# Patient Record
Sex: Male | Born: 1970 | Race: White | Hispanic: No | Smoking: Never smoker
Health system: Southern US, Community
[De-identification: ages and names within clinical notes are randomized; demographics above are authoritative.]

---

## 2006-10-25 ENCOUNTER — Emergency Department: Payer: Self-pay | Admitting: Emergency Medicine

## 2010-08-13 ENCOUNTER — Emergency Department: Payer: Self-pay | Admitting: Emergency Medicine

## 2013-09-07 ENCOUNTER — Emergency Department: Payer: Self-pay | Admitting: Emergency Medicine

## 2013-09-09 ENCOUNTER — Inpatient Hospital Stay: Payer: Self-pay | Admitting: Orthopedic Surgery

## 2013-09-09 LAB — CBC WITH DIFFERENTIAL/PLATELET
Basophil #: 0 10*3/uL (ref 0.0–0.1)
Basophil %: 0.4 %
Eosinophil %: 4.6 %
HCT: 48.8 % (ref 40.0–52.0)
HGB: 16.3 g/dL (ref 13.0–18.0)
MCHC: 33.5 g/dL (ref 32.0–36.0)
Monocyte #: 0.9 x10 3/mm (ref 0.2–1.0)
Monocyte %: 8.7 %
Neutrophil #: 7.2 10*3/uL — ABNORMAL HIGH (ref 1.4–6.5)

## 2013-09-09 LAB — BASIC METABOLIC PANEL
BUN: 11 mg/dL (ref 7–18)
Calcium, Total: 9.6 mg/dL (ref 8.5–10.1)
Chloride: 108 mmol/L — ABNORMAL HIGH (ref 98–107)
Creatinine: 1.27 mg/dL (ref 0.60–1.30)
EGFR (Non-African Amer.): >60
Glucose: 92 mg/dL (ref 65–99)
Osmolality: 277 (ref 275–301)
Potassium: 4.1 mmol/L (ref 3.5–5.1)
Sodium: 139 mmol/L (ref 136–145)

## 2013-09-10 LAB — CREATININE, SERUM
Creatinine: 1.39 mg/dL — ABNORMAL HIGH (ref 0.60–1.30)
EGFR (Non-African Amer.): >60

## 2013-09-11 LAB — CREATININE, SERUM
Creatinine: 1.26 mg/dL (ref 0.60–1.30)
EGFR (African American): >60
EGFR (Non-African Amer.): >60

## 2013-09-11 LAB — WBC: WBC: 6.6 10*3/uL (ref 3.8–10.6)

## 2013-09-13 LAB — WOUND CULTURE

## 2013-09-14 LAB — CULTURE, BLOOD (SINGLE)

## 2014-07-10 ENCOUNTER — Emergency Department: Payer: Self-pay | Admitting: Emergency Medicine

## 2014-07-12 ENCOUNTER — Emergency Department: Payer: Self-pay | Admitting: Emergency Medicine

## 2015-01-11 IMAGING — CR LEFT RING FINGER 2+V
1 series · 3 of 3 positions shown · non-contrast
Comparison: None.

CLINICAL DATA: Finger injury, possible spider bite. Pain and
swelling. Evaluate for osteomyelitis.

EXAM:
LEFT RING FINGER 2+V

[Series 1: pa · 0.17mm/px · 3 of 3 slices shown]
[im 1/3]
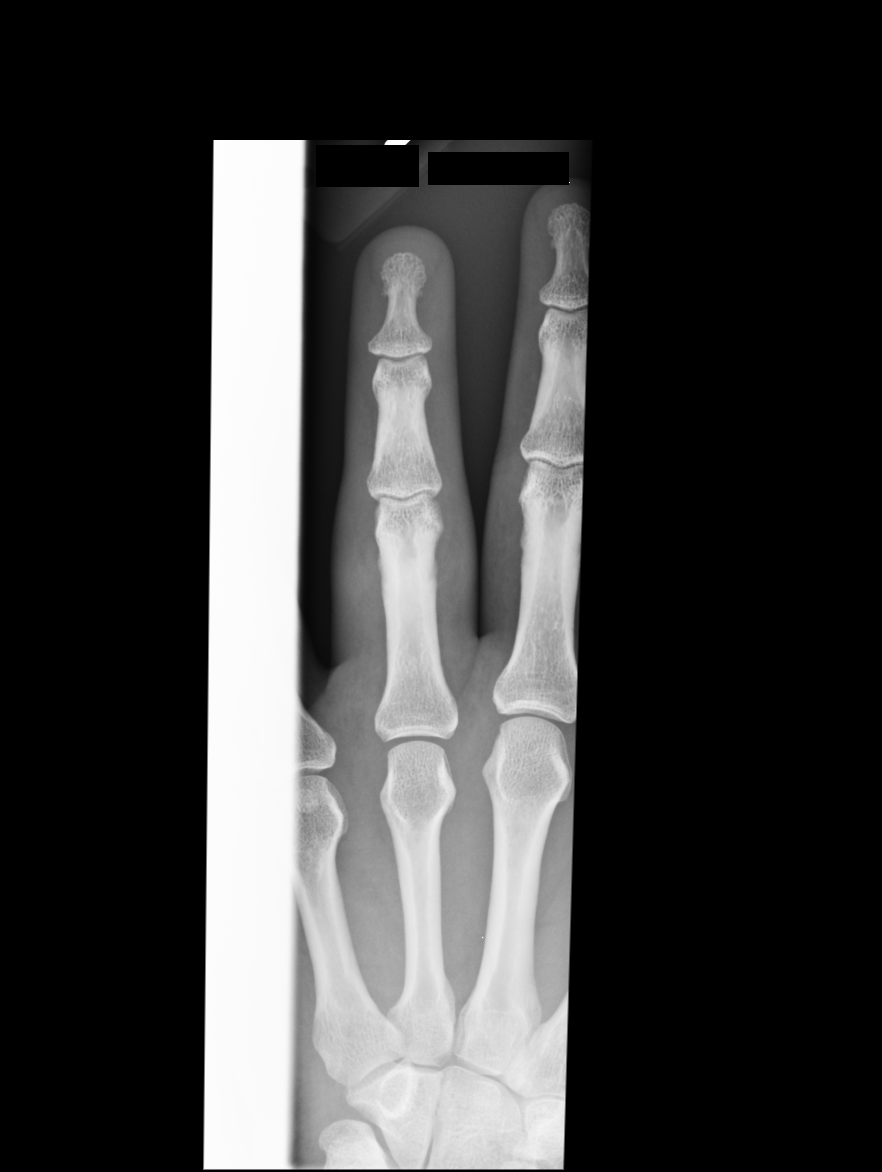
[im 2/3]
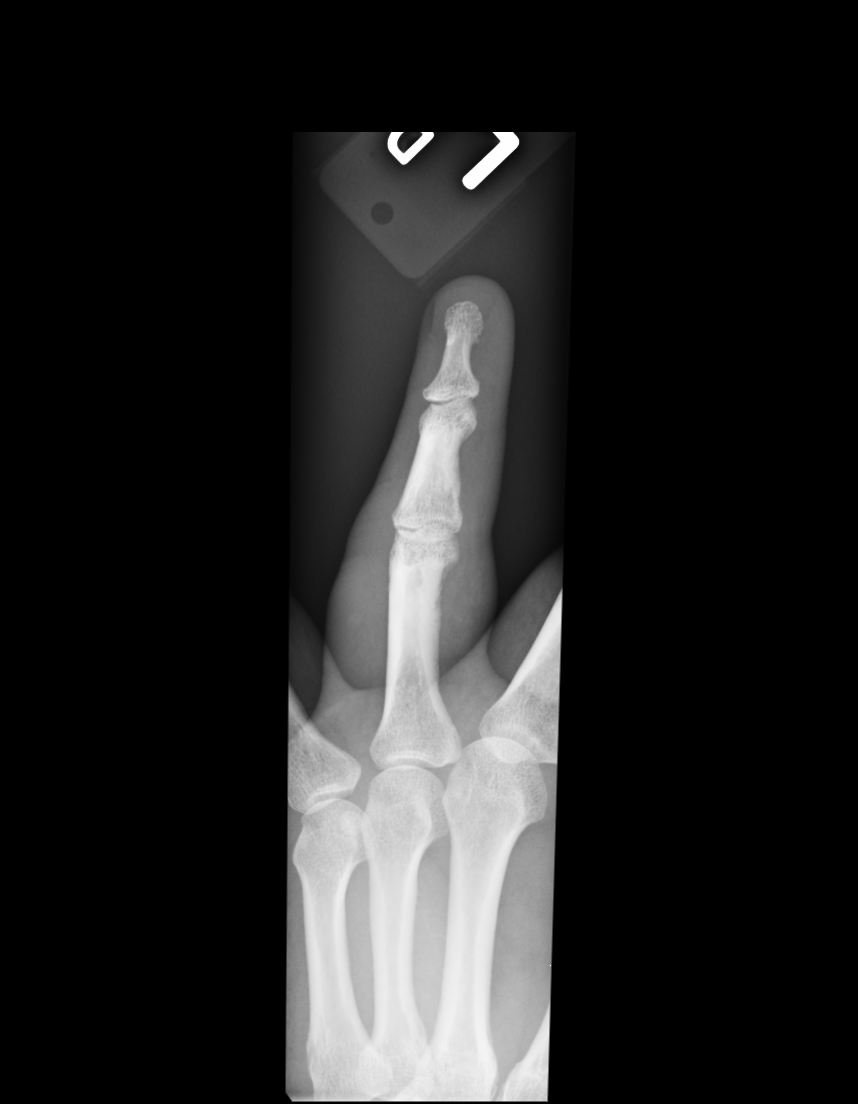
[im 3/3]
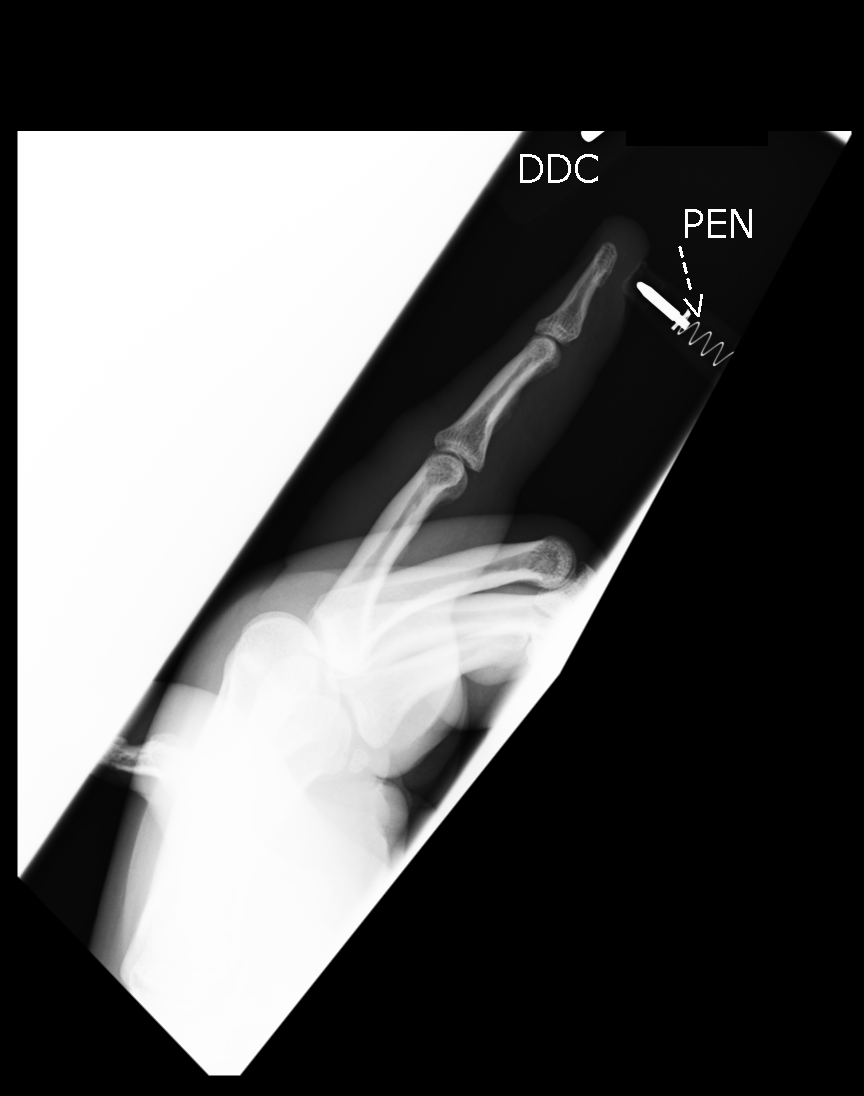

[3 of 3 positions shown; findings below may reference images not displayed]

FINDINGS: There is soft tissue swelling without osseous erosion to suggest
osteomyelitis.
IMPRESSION: Soft tissue swelling without acute osseous abnormality.

## 2015-01-16 NOTE — Op Note (Signed)
PATIENT NAME:  Vincent Berg, Vincent Berg MR#:  [id number] DATE OF BIRTH:  10/04/1970  DATE OF PROCEDURE:  09/09/2013  PREOPERATIVE DIAGNOSIS: Left ring finger abscess over the dorsal proximal phalanx.   POSTOPERATIVE DIAGNOSIS: Left ring finger abscess over the dorsal proximal phalanx.    PROCEDURE: Incision and drainage, left proximal finger abscess.   ANESTHESIA: General.   SPECIMEN: Culture swab to microbiology.   SURGEON: Kathreen DevoidKevin Berg. Kassim Guertin, M.D.   ESTIMATED BLOOD LOSS: Minimal.   COMPLICATIONS: None.   INDICATIONS FOR PROCEDURE: [name] is a 44 year old male who has had approximately 5 days of redness and swelling over the dorsal aspect of the proximal phalanx of the ring finger. The actual cause of the abscess is unknown, although the patient suspects possibly a spider bite. He was seen in the Emergency Department on 09/07/2013. There is a report of drainage of an abscess, and he was placed on Bactrim. The patient then returned today after his symptoms were not improving. Given that he is failing antibiotics and has a clear abscess of his proximal left ring finger, I recommended an incision and drainage. I reviewed the risks and benefits of the surgery with the patient. I marked the left hand with the word "yes" according to the hospital's right site protocol.   PROCEDURE NOTE: The patient was brought to the operating room where he underwent general anesthesia. He was prepped and draped in a sterile fashion. A timeout was performed to verify the patient's name, date of birth, medical record number, correct site of surgery and correct procedure to be performed. It was also used to verify the patient had received antibiotics. He had received Zosyn and vancomycin on the floor upon admission.   The patient then had an approximately 15 to 20 mm incision made over the midline of his proximal phalanx directly over the abscess. The abscess was then drained. Purulent fluid was identified, and a culture swab  of this was taken and sent to microbiology. The wound was then copiously irrigated with a liter of GU-impregnated saline. The skin edges were curetted to remove any remaining infected tissue. A single vascular loop was then placed as a drain in the wound. The wound was then closed with 4-0 nylon. A dry sterile dressing was then applied. A volar splint was then placed over the hand and wrist. The patient was awoken and brought to the PACU in stable condition. I was scrubbed and present for the entire case, and all sharp and instrument counts were correct at the conclusion of the case. I spoke with his wife in the postop consultation room to let her know the case had gone without complication and the patient was stable in the recovery room. He is being admitted for further IV antibiotics, and I will continue monitoring the culture.   ____________________________ Kathreen DevoidKevin Berg. Gennesis Hogland, MD klk:gb D: 09/09/2013 23:38:52 ET T: 09/09/2013 23:46:37 ET JOB#: [id number]  cc: Kathreen DevoidKevin Berg. Kashtyn Jankowski, MD, <Dictator> Kathreen DevoidKEVIN Berg Baylea Milburn MD ELECTRONICALLY SIGNED 09/20/2013 14:41

## 2015-01-17 NOTE — Discharge Summary (Signed)
PATIENT NAME:  [name], Vincent L MR#:  [id number] DATE OF BIRTH:  1971/03/02  DATE OF ADMISSION:  09/09/2013 DATE OF DISCHARGE:  09/11/2013  REASON FOR ADMISSION: Left ring finger abscess.   HISTORY OF PRESENT ILLNESS: Mr. Vincent Berg is a 44 year old male who presented to the Emergency Department after 5 days of redness and swelling over the dorsal aspect of the proximal phalanx of the ring finger. The etiology behind the abscess was unknown, but the patient suspected a possible insect or spider bite. He had reportedly had the abscess drained on 09/07/2013. He was placed on Bactrim. Despite the antibiotics, his condition worsened. He returned to the ER today with worsening redness, swelling and pain.   The patient was admitted to orthopedics for further evaluation and management. He was brought to the operating room upon admission where he underwent incision and drainage of the left ring finger abscess. A Miguez drain was placed. Following the operation, he returned to the orthopedic floor for elevation of the left hand and intravenous antibiotics. From the time of admission, he had been on vancomycin and Zosyn.   On postop day #1, the patient was doing well. His pain was much improved and he had no fever, chills overnight. Cultures were growing gram-positive cocci in clusters which finally grew out to be MRSA. The sensitivities indicated that the MRSA was susceptible to Bactrim. The final cultures were available on 09/11/2013.   Given the patient's clinical improvement, he was prepared for discharge. He had his dressings changed daily while in the hospital by me personally.   His white count on admission was 10.4, and upon discharge, it was 6.6. His final cultures showed heavy growth of methicillin-resistant Staph aureus.   The patient was discharged with instructions to follow up with me in approximately 7 days in the office. He will continue taking Bactrim double strength 1 tablet p.o. b.i.d. in the  meantime. He was given 20 tablets of Norco 5/325 mg tablets to be taken 1 tablet every four to 6 hours p.r.n. for pain. He needs to keep his arm elevated and to keep the bandage on until followup. The patient's drain had been removed on postop day #1.    He will contact me with any worsening redness, swelling, or drainage or if he develops fever or chills.   The patient and his wife who was with him at the time of discharge understood and agreed with this plan.   ____________________________ Kathreen DevoidKevin L. Richele Strand, MD klk:np D: 09/25/2013 15:49:29 ET T: 09/25/2013 16:50:38 ET JOB#: [id number]  cc: Kathreen DevoidKevin L. Willy Vorce, MD, <Dictator> Kathreen DevoidKEVIN L Gissel Keilman MD ELECTRONICALLY SIGNED 10/09/2013 13:52

## 2017-08-29 ENCOUNTER — Emergency Department
Admission: EM | Admit: 2017-08-29 | Discharge: 2017-08-29 | Disposition: A | Discharge status: Home / Self Care | Payer: Self-pay | Attending: Emergency Medicine | Admitting: Emergency Medicine

## 2017-08-29 ENCOUNTER — Other Ambulatory Visit: Payer: Self-pay

## 2017-08-29 ENCOUNTER — Emergency Department: Payer: Self-pay

## 2017-08-29 ENCOUNTER — Encounter: Payer: Self-pay | Admitting: Emergency Medicine

## 2017-08-29 DIAGNOSIS — J029 Acute pharyngitis, unspecified: Secondary | ICD-10-CM | POA: Insufficient documentation

## 2017-08-29 DIAGNOSIS — J40 Bronchitis, not specified as acute or chronic: Secondary | ICD-10-CM | POA: Insufficient documentation

## 2017-08-29 DIAGNOSIS — R509 Fever, unspecified: Secondary | ICD-10-CM | POA: Insufficient documentation

## 2017-08-29 DIAGNOSIS — B349 Viral infection, unspecified: Secondary | ICD-10-CM | POA: Insufficient documentation

## 2017-08-29 DIAGNOSIS — R197 Diarrhea, unspecified: Secondary | ICD-10-CM | POA: Insufficient documentation

## 2017-08-29 LAB — CBC WITH DIFFERENTIAL/PLATELET
BASOS PCT: 1 %
Basophils Absolute: 0 10*3/uL (ref 0–0.1)
EOS ABS: 0 10*3/uL (ref 0–0.7)
EOS PCT: 0 %
HCT: 49.7 % (ref 40.0–52.0)
HEMOGLOBIN: 17.3 g/dL (ref 13.0–18.0)
LYMPHS ABS: 1.1 10*3/uL (ref 1.0–3.6)
Lymphocytes Relative: 12 %
MCH: 28.5 pg (ref 26.0–34.0)
MCHC: 34.9 g/dL (ref 32.0–36.0)
MCV: 81.9 fL (ref 80.0–100.0)
Monocytes Absolute: 1.2 10*3/uL — ABNORMAL HIGH (ref 0.2–1.0)
Monocytes Relative: 13 %
NEUTROS PCT: 74 %
Neutro Abs: 7.1 10*3/uL — ABNORMAL HIGH (ref 1.4–6.5)
PLATELETS: 150 10*3/uL (ref 150–440)
RBC: 6.07 MIL/uL — AB (ref 4.40–5.90)
RDW: 13.4 % (ref 11.5–14.5)
WBC: 9.5 10*3/uL (ref 3.8–10.6)

## 2017-08-29 LAB — COMPREHENSIVE METABOLIC PANEL
ALK PHOS: 85 U/L (ref 38–126)
ALT: 52 U/L (ref 17–63)
AST: 47 U/L — AB (ref 15–41)
Albumin: 3.6 g/dL (ref 3.5–5.0)
Anion gap: 13 (ref 5–15)
BILIRUBIN TOTAL: 1 mg/dL (ref 0.3–1.2)
BUN: 12 mg/dL (ref 6–20)
CALCIUM: 8.9 mg/dL (ref 8.9–10.3)
CHLORIDE: 102 mmol/L (ref 101–111)
CO2: 20 mmol/L — ABNORMAL LOW (ref 22–32)
CREATININE: 1.1 mg/dL (ref 0.61–1.24)
Glucose, Bld: 101 mg/dL — ABNORMAL HIGH (ref 65–99)
Potassium: 3.6 mmol/L (ref 3.5–5.1)
Sodium: 135 mmol/L (ref 135–145)
TOTAL PROTEIN: 6.6 g/dL (ref 6.5–8.1)

## 2017-08-29 LAB — INFLUENZA PANEL BY PCR (TYPE A & B)
INFLAPCR: NEGATIVE
INFLBPCR: NEGATIVE

## 2017-08-29 LAB — LACTIC ACID, PLASMA: LACTIC ACID, VENOUS: 1.4 mmol/L (ref 0.5–1.9)

## 2017-08-29 MED ORDER — IPRATROPIUM-ALBUTEROL 0.5-2.5 (3) MG/3ML IN SOLN
9.0000 mL | Freq: Once | RESPIRATORY_TRACT | Status: AC
  Administered 2017-08-29: 9 mL via RESPIRATORY_TRACT
  Filled 2017-08-29: qty 6

## 2017-08-29 MED ORDER — SODIUM CHLORIDE 0.9 % IV BOLUS (SEPSIS)
1000.0000 mL | Freq: Once | INTRAVENOUS | Status: AC
  Administered 2017-08-29: 1000 mL via INTRAVENOUS

## 2017-08-29 MED ORDER — ONDANSETRON HCL 4 MG PO TABS: 4.0000 mg | ORAL_TABLET | Freq: Every day | ORAL | 0 refills | Status: DC | PRN

## 2017-08-29 MED ORDER — KETOROLAC TROMETHAMINE 30 MG/ML IJ SOLN
30.0000 mg | Freq: Once | INTRAMUSCULAR | Status: AC
  Administered 2017-08-29: 30 mg via INTRAVENOUS
  Filled 2017-08-29: qty 1

## 2017-08-29 MED ORDER — PREDNISONE 50 MG PO TABS: ORAL_TABLET | ORAL | 0 refills | Status: AC

## 2017-08-29 MED ORDER — ONDANSETRON HCL 4 MG/2ML IJ SOLN
4.0000 mg | Freq: Once | INTRAMUSCULAR | Status: AC
  Administered 2017-08-29: 4 mg via INTRAVENOUS
  Filled 2017-08-29: qty 2

## 2017-08-29 MED ORDER — ALBUTEROL SULFATE HFA 108 (90 BASE) MCG/ACT IN AERS: 2.0000 | INHALATION_SPRAY | Freq: Four times a day (QID) | RESPIRATORY_TRACT | 0 refills | Status: AC | PRN

## 2017-08-29 MED ORDER — METHYLPREDNISOLONE SODIUM SUCC 125 MG IJ SOLR
125.0000 mg | Freq: Once | INTRAMUSCULAR | Status: AC
  Administered 2017-08-29: 125 mg via INTRAVENOUS
  Filled 2017-08-29: qty 2

## 2017-08-29 NOTE — ED Triage Notes (Signed)
Pt to ED c/o cough, fever, sore throat since Saturday.  States feels similar to pneumonia in the past.  Productive cough of large brown sputum.  Wheezes and rhonchi noted to bilateral lungs.

## 2017-08-29 NOTE — ED Provider Notes (Signed)
[facility] Emergency Department Provider Note  ____________________________________________   First MD Initiated Contact with Patient 08/29/17 1821     (approximate)  I have reviewed the triage vital signs and the nursing notes.   HISTORY  Chief Complaint Cough and Fever   HPI [name] is a 46 y.o. male without any chronic medical conditions was presented with 4 days of nausea, vomiting, diarrhea as well as fever and body aches.  He also says that he is having sore throat, cough and runny nose.  Says that he has been taking Tylenol Cold and flu at home and last took it at 4 PM.   Still feeling nauseous.  No known sick contacts.  Thinks that he started this illness with "food poisoning."  However, he says that nobody else had any similar symptoms.  Patient denies smoking.  Says that he is coughing up brown tinged sputum.    History reviewed. No pertinent past medical history.  There are no active problems to display for this patient.   History reviewed. No pertinent surgical history.  Prior to Admission medications   Not on File    Allergies Patient has no known allergies.  History reviewed. No pertinent family history.  Social History Social History   Tobacco Use  . Smoking status: Never Smoker  . Smokeless tobacco: Never Used  Substance Use Topics  . Alcohol use: No    Frequency: Never  . Drug use: No    Review of Systems  Constitutional: Positive for fever Eyes: No visual changes. ENT: Positive for sore throat Cardiovascular: Denies chest pain. Respiratory: As above Gastrointestinal: No abdominal pain.  No nausea, no vomiting.  No diarrhea.  No constipation. Genitourinary: Negative for dysuria. Musculoskeletal: Myalgias Skin: Negative for rash. Neurological: Negative for headaches, focal weakness or numbness.   ____________________________________________   PHYSICAL EXAM:  VITAL SIGNS: ED Triage Vitals  Enc Vitals  Group     BP 08/29/17 1808 124/74     Pulse Rate 08/29/17 1808 (!) 102     Resp 08/29/17 1808 20     Temp 08/29/17 1808 (!) 100.8 F (38.2 C)     Temp Source 08/29/17 1808 Oral     SpO2 08/29/17 1808 98 %     Weight 08/29/17 1811 180 lb (81.6 kg)     Height 08/29/17 1811 5\' 5"  (1.651 m)     Head Circumference --      Peak Flow --      Pain Score 08/29/17 1814 7     Pain Loc --      Pain Edu? --      Excl. in GC? --     Constitutional: Alert and oriented. in no acute distress. Eyes: Conjunctivae are normal.  Head: Atraumatic. Nose: No congestion/rhinnorhea. Mouth/Throat: Mucous membranes are moist.  Mild pharyngeal erythema without any tonsillar swelling or exudate. Neck: No stridor.   Cardiovascular: Normal rate, regular rhythm. Grossly normal heart sounds.  Respiratory: Normal respiratory effort.  No retractions.  Scant wheezing throughout all fields with a mild expiratory cough. Gastrointestinal: Soft with mild and diffuse tenderness to palpation. No distention.  Musculoskeletal: No lower extremity tenderness nor edema.  No joint effusions. Neurologic:  Normal speech and language. No gross focal neurologic deficits are appreciated. Skin:  Skin is warm, dry and intact. No rash noted. Psychiatric: Mood and affect are normal. Speech and behavior are normal.  ____________________________________________   LABS (all labs ordered are listed, but only abnormal results  are displayed)  Labs Reviewed  COMPREHENSIVE METABOLIC PANEL - Abnormal; Notable for the following components:      Result Value   CO2 20 (*)    Glucose, Bld 101 (*)    AST 47 (*)    All other components within normal limits  CBC WITH DIFFERENTIAL/PLATELET - Abnormal; Notable for the following components:   RBC 6.07 (*)    Neutro Abs 7.1 (*)    Monocytes Absolute 1.2 (*)    All other components within normal limits  LACTIC ACID, PLASMA  INFLUENZA PANEL BY PCR (TYPE A & B)  LACTIC ACID, PLASMA  URINALYSIS,  COMPLETE (UACMP) WITH MICROSCOPIC   ____________________________________________  EKG   ____________________________________________  RADIOLOGY  No acute disease ____________________________________________   PROCEDURES  Procedure(s) performed:   Procedures  Critical Care performed:   ____________________________________________   INITIAL IMPRESSION / ASSESSMENT AND PLAN / ED COURSE  Pertinent labs & imaging results that were available during my care of the patient were reviewed by me and considered in my medical decision making (see chart for details).  DDX: Viral syndrome, pneumonia, bronchitis, nausea vomiting and diarrhea, strep throat, rhinorrhea  As part of my medical decision making, I reviewed the following data within the electronic MEDICAL RECORD NUMBER Old chart reviewed   ----------------------------------------- 8:05 PM on 08/29/2017 -----------------------------------------  Patient at this time is feeling greatly improved.  He is eating a sandwich tray.  He is no longer wheezing and his lungs are clear to auscultation throughout.  He also feels subjectively improved.  Heart rate is 104 but this is most likely secondary to the nebulizer treatments that he received.  I retook his temperature and it was 98.3.  Flu tests negative.  Likely viral syndrome.  Patient will be discharged home with steroids, Zofran and an inhaler.  He is understanding of the diagnosis as well as the treatment plan willing to comply.     ____________________________________________   FINAL CLINICAL IMPRESSION(S) / ED DIAGNOSES  Viral syndrome.  Bronchitis.    NEW MEDICATIONS STARTED DURING THIS VISIT:  This SmartLink is deprecated. Use AVSMEDLIST instead to display the medication list for a patient.   Note:  This document was prepared using Dragon voice recognition software and may include unintentional dictation errors.     Myrna BlazerSchaevitz, Dayvion Sans Matthew, MD 08/29/17 2005

## 2017-08-29 NOTE — ED Notes (Signed)
Patient transported to X-ray 

## 2020-07-13 ENCOUNTER — Emergency Department
Admission: EM | Admit: 2020-07-13 | Discharge: 2020-07-13 | Disposition: A | Discharge status: Home / Self Care | Payer: HRSA Program | Attending: Emergency Medicine | Admitting: Emergency Medicine

## 2020-07-13 ENCOUNTER — Emergency Department: Payer: HRSA Program

## 2020-07-13 ENCOUNTER — Encounter: Payer: Self-pay | Admitting: Emergency Medicine

## 2020-07-13 ENCOUNTER — Other Ambulatory Visit: Payer: Self-pay

## 2020-07-13 DIAGNOSIS — U071 COVID-19: Secondary | ICD-10-CM | POA: Diagnosis not present

## 2020-07-13 DIAGNOSIS — R112 Nausea with vomiting, unspecified: Secondary | ICD-10-CM | POA: Diagnosis present

## 2020-07-13 LAB — BASIC METABOLIC PANEL
Anion gap: 10 (ref 5–15)
BUN: 11 mg/dL (ref 6–20)
CO2: 26 mmol/L (ref 22–32)
Calcium: 9.1 mg/dL (ref 8.9–10.3)
Chloride: 102 mmol/L (ref 98–111)
Creatinine, Ser: 0.82 mg/dL (ref 0.61–1.24)
GFR, Estimated: >60 mL/min (ref >60)
Glucose, Bld: 99 mg/dL (ref 70–99)
Potassium: 3.5 mmol/L (ref 3.5–5.1)
Sodium: 138 mmol/L (ref 135–145)

## 2020-07-13 LAB — CBC
HCT: 45.2 % (ref 39.0–52.0)
Hemoglobin: 15.8 g/dL (ref 13.0–17.0)
MCH: 28.2 pg (ref 26.0–34.0)
MCHC: 35 g/dL (ref 30.0–36.0)
MCV: 80.6 fL (ref 80.0–100.0)
Platelets: 138 10*3/uL — ABNORMAL LOW (ref 150–400)
RBC: 5.61 MIL/uL (ref 4.22–5.81)
RDW: 12.6 % (ref 11.5–15.5)
WBC: 4.2 10*3/uL (ref 4.0–10.5)
nRBC: 0 % (ref 0.0–0.2)

## 2020-07-13 LAB — TROPONIN I (HIGH SENSITIVITY): Troponin I (High Sensitivity): 14 ng/L (ref <18)

## 2020-07-13 MED ORDER — KETOROLAC TROMETHAMINE 30 MG/ML IJ SOLN
15.0000 mg | Freq: Once | INTRAMUSCULAR | Status: AC
  Administered 2020-07-13: 15 mg via INTRAVENOUS
  Filled 2020-07-13: qty 1

## 2020-07-13 MED ORDER — LACTATED RINGERS IV BOLUS
1000.0000 mL | Freq: Once | INTRAVENOUS | Status: AC
  Administered 2020-07-13: 1000 mL via INTRAVENOUS

## 2020-07-13 MED ORDER — ONDANSETRON HCL 4 MG/2ML IJ SOLN
4.0000 mg | Freq: Once | INTRAMUSCULAR | Status: AC
  Administered 2020-07-13: 4 mg via INTRAVENOUS
  Filled 2020-07-13: qty 2

## 2020-07-13 MED ORDER — ONDANSETRON 4 MG PO TBDP: 4.0000 mg | ORAL_TABLET | Freq: Three times a day (TID) | ORAL | 0 refills | Status: AC | PRN

## 2020-07-13 NOTE — ED Provider Notes (Signed)
[facility] Emergency Department Provider Note   ____________________________________________   First MD Initiated Contact with Patient 07/13/20 1830     (approximate)  I have reviewed the triage vital signs and the nursing notes.   HISTORY  Chief Complaint Nausea    HPI Vincent Berg is a 49 y.o. male with no significant past medical history who presents to the ED complaining of nausea and vomiting.  Patient reports that he has been dealing with fever, cough, chest congestion, and nausea for the past few days.  He was diagnosed with Covid yesterday at a [facility] drive-through testing center.  When he woke up this morning, he was feeling very nauseous and vomited up the water he tried to drink.  He has not been able to tolerate any liquids or solids since then.  He has had some diarrhea, but denies any blood in his stool.  His fevers have improved and he denies significant difficulty breathing at this time, has not had any chest pain.  He does complain of diffuse body aches.  He has not been vaccinated against COVID-19.        History reviewed. No pertinent past medical history.  There are no problems to display for this patient.   History reviewed. No pertinent surgical history.  Prior to Admission medications   Medication Sig Start Date End Date Taking? Authorizing Provider  albuterol (PROVENTIL HFA;VENTOLIN HFA) 108 (90 Base) MCG/ACT inhaler Inhale 2 puffs into the lungs every 6 (six) hours as needed. 08/29/17   Myrna Blazer, MD  ondansetron (ZOFRAN ODT) 4 MG disintegrating tablet Take 1 tablet (4 mg total) by mouth every 8 (eight) hours as needed for nausea or vomiting. 07/13/20   Chesley Noon, MD  predniSONE (DELTASONE) 50 MG tablet Take 1 tab Daily x5 days 08/29/17   Myrna Blazer, MD    Allergies Patient has no known allergies.  History reviewed. No pertinent family history.  Social History Social History   Tobacco Use    . Smoking status: Never Smoker  . Smokeless tobacco: Never Used  Substance Use Topics  . Alcohol use: No  . Drug use: No    Review of Systems  Constitutional: No fever/chills Eyes: No visual changes. ENT: No sore throat. Cardiovascular: Denies chest pain. Respiratory: Denies shortness of breath.  Positive for cough. Gastrointestinal: No abdominal pain.  Positive for nausea, vomiting, and diarrhea.  No constipation. Genitourinary: Negative for dysuria. Musculoskeletal: Negative for back pain.  Positive for myalgias. Skin: Negative for rash. Neurological: Negative for headaches, focal weakness or numbness.  ____________________________________________   PHYSICAL EXAM:  VITAL SIGNS: ED Triage Vitals  Enc Vitals Group     BP 07/13/20 1542 130/86     Pulse Rate 07/13/20 1542 61     Resp 07/13/20 1542 16     Temp 07/13/20 1542 98.3 F (36.8 C)     Temp Source 07/13/20 1542 Oral     SpO2 07/13/20 1542 99 %     Weight 07/13/20 1543 179 lb 14.3 oz (81.6 kg)     Height 07/13/20 1543 5\' 5"  (1.651 m)     Head Circumference --      Peak Flow --      Pain Score 07/13/20 1543 4     Pain Loc --      Pain Edu? --      Excl. in GC? --     Constitutional: Alert and oriented. Eyes: Conjunctivae are normal. Head: Atraumatic. Nose:  No congestion/rhinnorhea. Mouth/Throat: Mucous membranes are dry. Neck: Normal ROM Cardiovascular: Normal rate, regular rhythm. Grossly normal heart sounds. Respiratory: Normal respiratory effort.  No retractions. Lungs CTAB. Gastrointestinal: Soft and nontender. No distention. Genitourinary: deferred Musculoskeletal: No lower extremity tenderness nor edema. Neurologic:  Normal speech and language. No gross focal neurologic deficits are appreciated. Skin:  Skin is warm, dry and intact. No rash noted. Psychiatric: Mood and affect are normal. Speech and behavior are normal.  ____________________________________________   LABS (all labs ordered are  listed, but only abnormal results are displayed)  Labs Reviewed  CBC - Abnormal; Notable for the following components:      Result Value   Platelets 138 (*)    All other components within normal limits  BASIC METABOLIC PANEL  TROPONIN I (HIGH SENSITIVITY)   ____________________________________________  EKG  ED ECG REPORT I, Chesley Noon, the attending physician, personally viewed and interpreted this ECG.   Date: 07/13/2020  EKG Time: 15:40  Rate: 56  Rhythm: sinus bradycardia  Axis: Normal  Intervals:none  ST&T Change: None  PROCEDURES  Procedure(s) performed (including Critical Care):  Procedures   ____________________________________________   INITIAL IMPRESSION / ASSESSMENT AND PLAN / ED COURSE       49 year old male with no significant past medical history presents to the ED complaining of persistent nausea and vomiting along with diffuse body aches after testing positive for COVID-19 yesterday.  He is not in any respiratory distress and is maintaining O2 sats on room air, chest x-ray is negative for acute process by my read.  EKG is also unremarkable, lab work reassuring with troponin within normal limits.  Given he has had difficulty tolerating liquids, we will place IV, hydrate with IV fluids, treat with Zofran and Toradol.  Patient reports feeling better following symptomatic treatment, he has been able to tolerate p.o. without difficulty.  He is appropriate for discharge home with a prescription for Zofran.  He was counseled to return to the ED for any new or worsening symptoms, patient agrees with plan.      ____________________________________________   FINAL CLINICAL IMPRESSION(S) / ED DIAGNOSES  Final diagnoses:  Non-intractable vomiting with nausea, unspecified vomiting type  COVID-19     ED Discharge Orders         Ordered    ondansetron (ZOFRAN ODT) 4 MG disintegrating tablet  Every 8 hours PRN        07/13/20 2020           Note:   This document was prepared using Dragon voice recognition software and may include unintentional dictation errors.   Chesley Noon, MD 07/13/20 2021

## 2020-07-13 NOTE — ED Triage Notes (Signed)
Pt presents via POV with c/o nausea and dehydration since recent diagnosis of covid yesterday. Pt endorses shortness of breath with exertion. Pt respirations appear even and unlabored at this time.

## 2021-11-14 IMAGING — CR DG CHEST 2V
1 series · 2 of 2 positions shown · non-contrast
Comparison: None.

CLINICAL DATA: Shortness of breath

EXAM:
CHEST - 2 VIEW

[Series 1: w chest pa · 0.14mm/px · 2 of 2 slices shown]
[im 1/2]
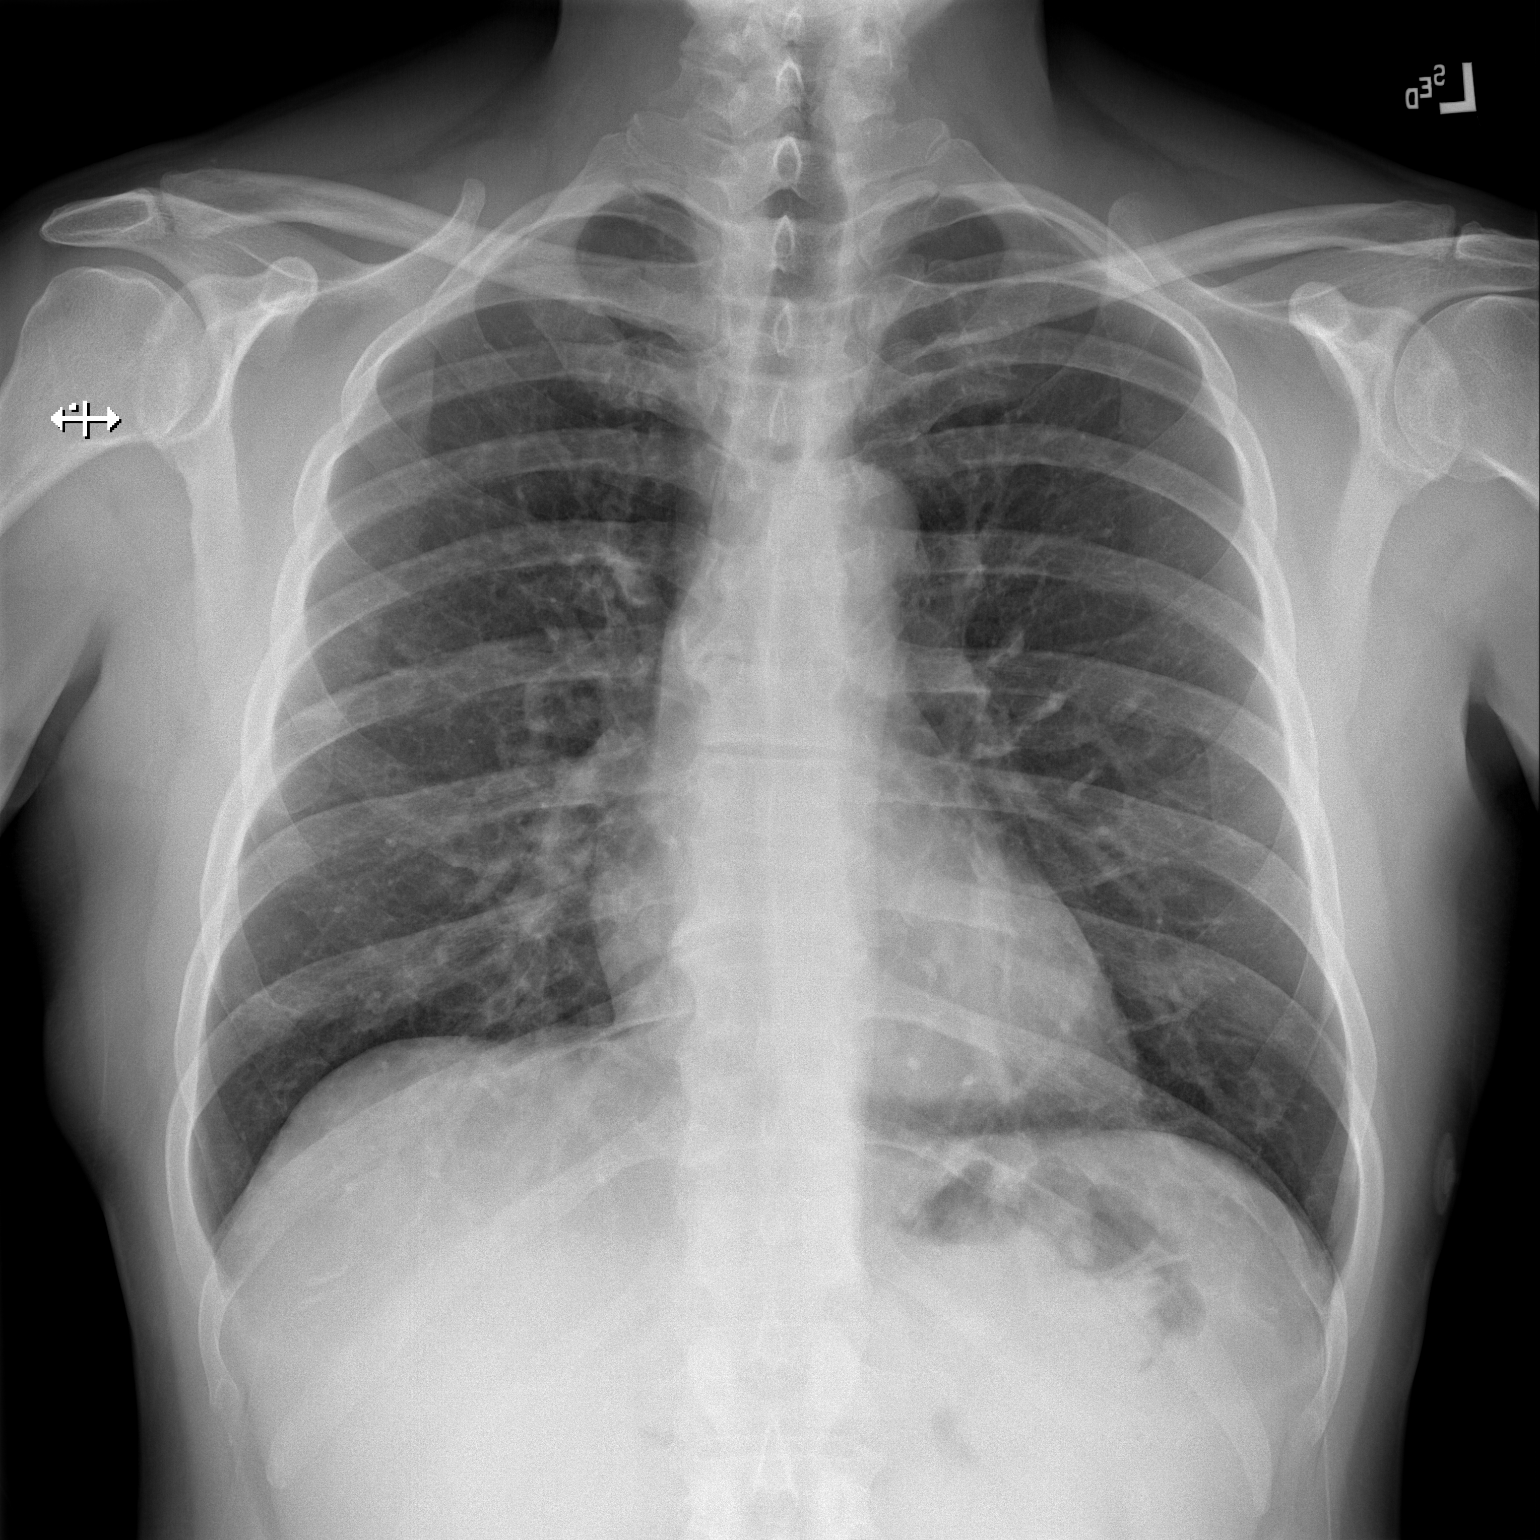
[im 2/2]
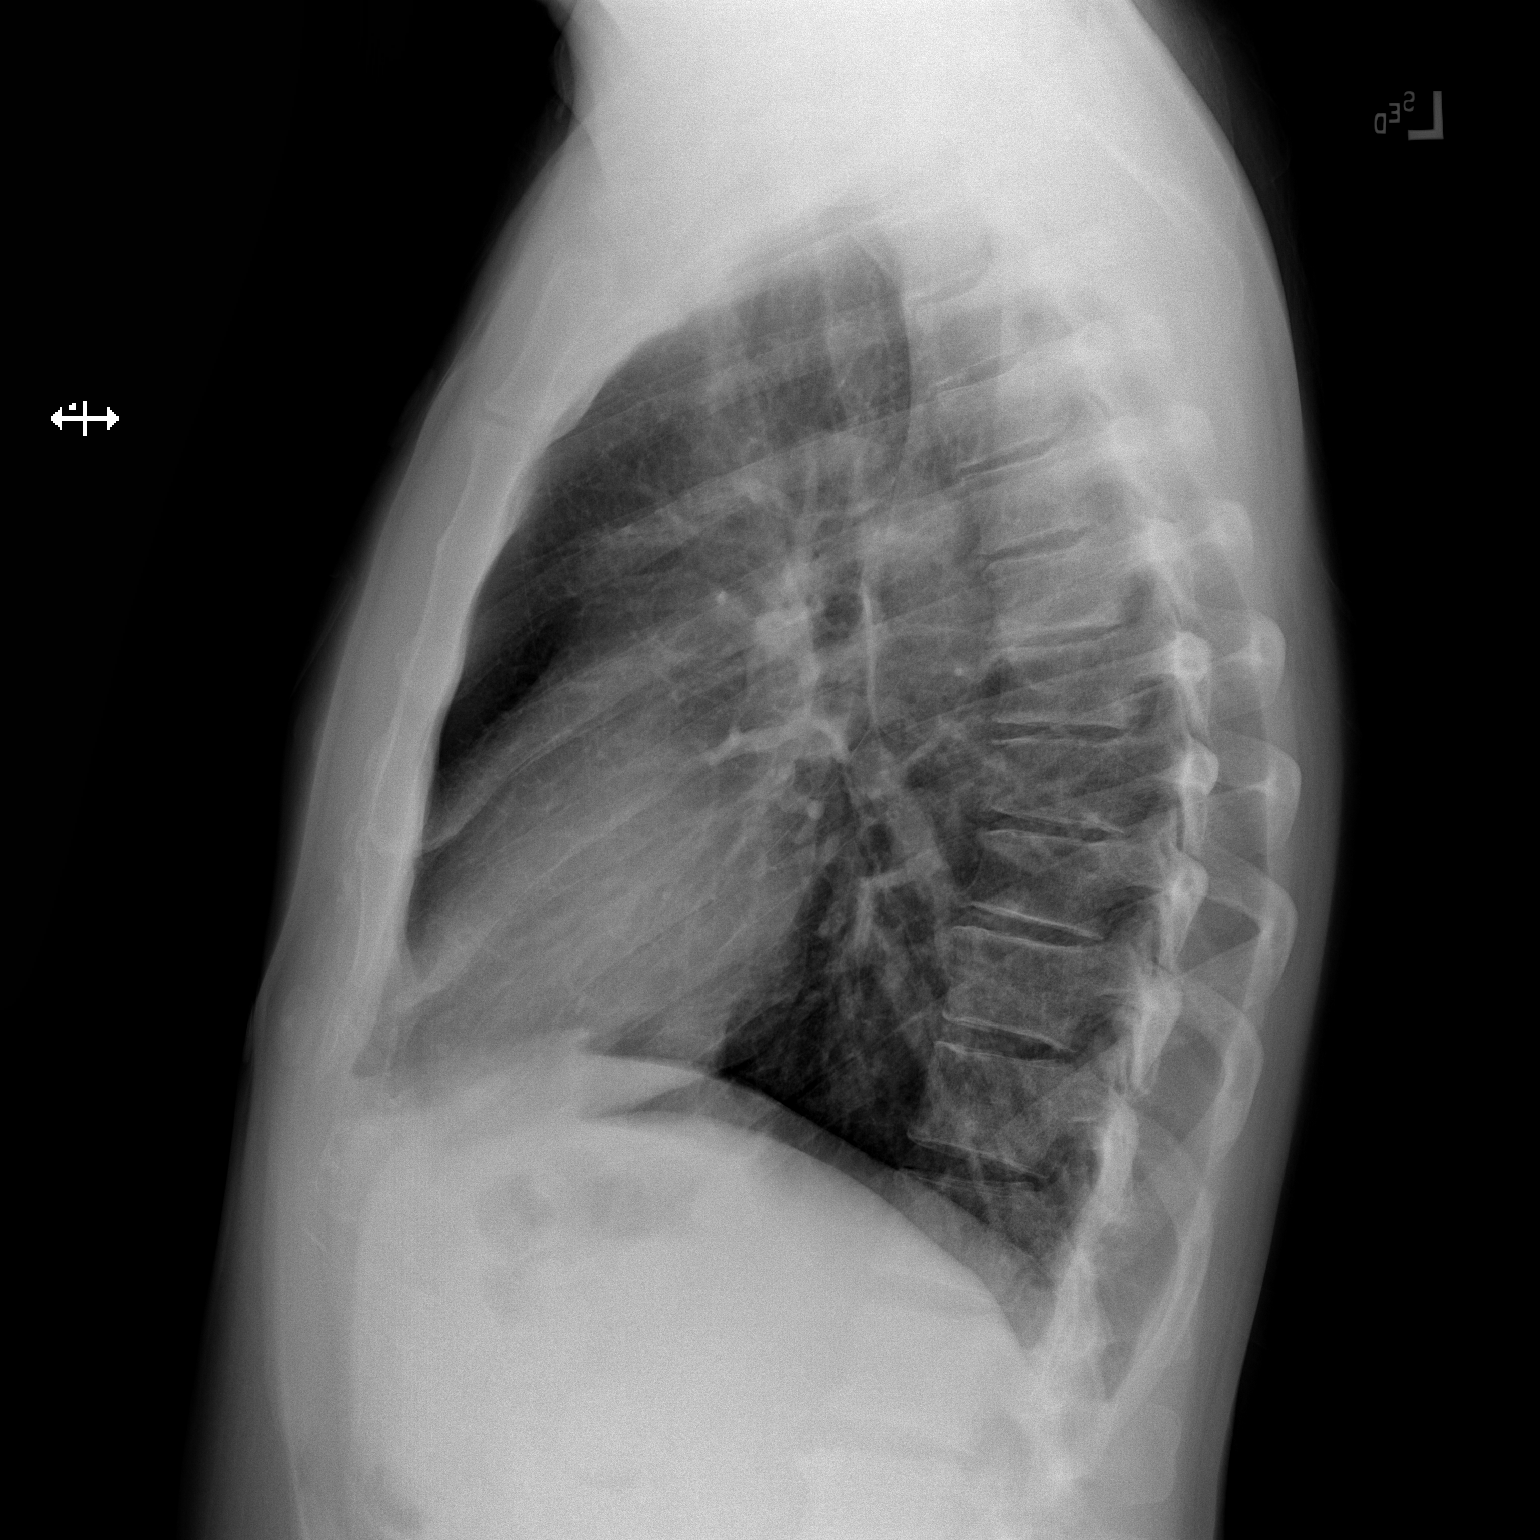

[2 of 2 positions shown; findings below may reference images not displayed]

FINDINGS: The heart size and mediastinal contours are within normal limits.
Both lungs are clear. No pleural effusion or pneumothorax. The
visualized skeletal structures are unremarkable.
IMPRESSION: No acute process in the chest.
